# Patient Record
Sex: Male | Born: 1990 | Race: White | Hispanic: No | Marital: Single | Smoking: Current every day smoker
Health system: Southern US, Community
[De-identification: ages and names within clinical notes are randomized; demographics above are authoritative.]

## PROBLEM LIST (undated history)

## (undated) DIAGNOSIS — F319 Bipolar disorder, unspecified: Secondary | ICD-10-CM

## (undated) DIAGNOSIS — F419 Anxiety disorder, unspecified: Secondary | ICD-10-CM

## (undated) HISTORY — PX: FOOT SURGERY: SHX648

---

## 2012-09-18 ENCOUNTER — Emergency Department (HOSPITAL_COMMUNITY)
Admission: EM | Admit: 2012-09-18 | Discharge: 2012-09-19 | Disposition: A | Discharge status: Home / Self Care | Payer: Self-pay | Attending: Emergency Medicine | Admitting: Emergency Medicine

## 2012-09-18 DIAGNOSIS — F411 Generalized anxiety disorder: Secondary | ICD-10-CM | POA: Insufficient documentation

## 2012-09-18 DIAGNOSIS — F319 Bipolar disorder, unspecified: Secondary | ICD-10-CM | POA: Insufficient documentation

## 2012-09-18 DIAGNOSIS — F17200 Nicotine dependence, unspecified, uncomplicated: Secondary | ICD-10-CM | POA: Insufficient documentation

## 2012-09-18 DIAGNOSIS — F419 Anxiety disorder, unspecified: Secondary | ICD-10-CM

## 2012-09-18 HISTORY — DX: Anxiety disorder, unspecified: F41.9

## 2012-09-18 HISTORY — DX: Bipolar disorder, unspecified: F31.9

## 2012-09-19 ENCOUNTER — Encounter (HOSPITAL_COMMUNITY): Payer: Self-pay | Admitting: Emergency Medicine

## 2012-09-19 LAB — POCT I-STAT, CHEM 8
BUN: 15 mg/dL (ref 6–23)
Calcium, Ion: 1.13 mmol/L (ref 1.12–1.23)
Chloride: 105 mEq/L (ref 96–112)
Creatinine, Ser: 0.9 mg/dL (ref 0.50–1.35)

## 2012-09-19 NOTE — ED Provider Notes (Signed)
Medical screening examination/treatment/procedure(s) were performed by non-physician practitioner and as supervising physician I was immediately available for consultation/collaboration.   Lyanne Co, MD 09/19/12 [phone]

## 2012-09-19 NOTE — ED Provider Notes (Signed)
History     CSN: [id number]  Arrival date & time 09/18/12  2350   First MD Initiated Contact with Patient 09/19/12 0010      Chief Complaint  Patient presents with  . multiple complaints     (Consider location/radiation/quality/duration/timing/severity/associated sxs/prior treatment) HPI Comments: Patient states, that approximately 1 hour after smoking a marijuana cigarette.  He developed a strange sensation in his left side, and felt as though it had a muscle spasm and swelled up and, then immediately went back.  He then proceeded to have a cold sensation just on the left side of his body.  That was replaced.  Shortly thereafter put a hot sensation to the left side of his body in the back of his head.  He denies any nausea or vomiting, weakness  The history is provided by the patient.    Past Medical History  Diagnosis Date  . Bipolar affective   . Anxiety     History reviewed. No pertinent past surgical history.  Family History  Problem Relation Age of Onset  . Heart attack Other     History  Substance Use Topics  . Smoking status: Current Every Day Smoker  . Smokeless tobacco: Not on file  . Alcohol Use: No      Review of Systems  Constitutional: Negative for fever and chills.  HENT: Negative for sore throat.   Respiratory: Negative for shortness of breath.   Cardiovascular: Negative for chest pain.  Gastrointestinal: Negative for abdominal pain.  Skin: Negative for rash and wound.  Neurological: Negative for dizziness, weakness and numbness.    Allergies  Review of patient's allergies indicates no known allergies.  Home Medications  No current outpatient prescriptions on file.  BP 125/64  Pulse 125  Temp 98.7 F (37.1 C) (Oral)  Resp 20  SpO2 98%  Physical Exam  Constitutional: He is oriented to person, place, and time. He appears well-developed and well-nourished.  HENT:  Head: Normocephalic.  Eyes: Pupils are equal, round, and reactive to  light.  Neck: Normal range of motion.  Cardiovascular: Tachycardia present.   Pulmonary/Chest: Effort normal. No respiratory distress.  Abdominal: Soft. He exhibits no distension. There is no tenderness.  Musculoskeletal: Normal range of motion.  Neurological: He is alert and oriented to person, place, and time.  Skin: Skin is warm.  Psychiatric: His mood appears anxious.    ED Course  Procedures (including critical care time)  Labs Reviewed  POCT I-STAT, CHEM 8 - Abnormal; Notable for the following:    Glucose, Bld 107 (*)     All other components within normal limits   No results found.   1. Anxiety     ED ECG REPORT   Date: 09/19/2012  EKG Time: 2:47 AM  Rate: 93  Rhythm: normal sinus rhythm,  there are no previous tracings available for comparison  Axis: normal  Intervals:none  ST&T Change: none  Narrative Interpretation: normal            MDM   I feel this patient is just anxious.  EKG, and i-STAT are normal        Arman Filter, NP 09/19/12 [phone]

## 2012-09-19 NOTE — ED Notes (Signed)
Pt states earlier this evening he got a bad muscle spasm on the left side near his lower rib area  Pt states then he developed chills  Pt states it went away  Pt states later he was watching a movie and got really hot and felt like his heart was beating really fast  Pt states now he is having a hot sensation running down both arms worse in the left arm than the right

## 2012-10-03 ENCOUNTER — Encounter (HOSPITAL_COMMUNITY): Payer: Self-pay | Admitting: Emergency Medicine

## 2012-10-03 ENCOUNTER — Emergency Department (HOSPITAL_COMMUNITY): Payer: Self-pay

## 2012-10-03 ENCOUNTER — Emergency Department (HOSPITAL_COMMUNITY)
Admission: EM | Admit: 2012-10-03 | Discharge: 2012-10-03 | Disposition: A | Discharge status: Home / Self Care | Payer: Self-pay | Attending: Emergency Medicine | Admitting: Emergency Medicine

## 2012-10-03 DIAGNOSIS — S4352XA Sprain of left acromioclavicular joint, initial encounter: Secondary | ICD-10-CM

## 2012-10-03 DIAGNOSIS — X500XXA Overexertion from strenuous movement or load, initial encounter: Secondary | ICD-10-CM | POA: Insufficient documentation

## 2012-10-03 DIAGNOSIS — S4350XA Sprain of unspecified acromioclavicular joint, initial encounter: Secondary | ICD-10-CM | POA: Insufficient documentation

## 2012-10-03 DIAGNOSIS — M25519 Pain in unspecified shoulder: Secondary | ICD-10-CM | POA: Insufficient documentation

## 2012-10-03 MED ORDER — IBUPROFEN 800 MG PO TABS
800.0000 mg | ORAL_TABLET | Freq: Once | ORAL | Status: AC
  Administered 2012-10-03: 800 mg via ORAL
  Filled 2012-10-03: qty 1

## 2012-10-03 MED ORDER — IBUPROFEN 800 MG PO TABS: 800.0000 mg | ORAL_TABLET | Freq: Three times a day (TID) | ORAL | Status: DC

## 2012-10-03 NOTE — ED Provider Notes (Signed)
History     CSN: [id number]  Arrival date & time 10/03/12  0217   First MD Initiated Contact with Patient 10/03/12 0324      Chief Complaint  Patient presents with  . Shoulder Pain    (Consider location/radiation/quality/duration/timing/severity/associated sxs/prior treatment) HPI History provided by pt.   Pt developed acute onset pain distal left clavicle when he coughed at 1:30am.  Pain is mildly improved now but aggravated by palpation and ROM of LUE.  Has pain in left side of his neck as well, most noticeable when he tips his head to the right.    No associated fever or extremity weakness/paresthesias.  No recent trauma.  His cough is intermittent and occurs mostly when he is nervous.   Past Medical History  Diagnosis Date  . Bipolar affective   . Anxiety     History reviewed. No pertinent past surgical history.  Family History  Problem Relation Age of Onset  . Heart attack Other     History  Substance Use Topics  . Smoking status: Current Every Day Smoker    Types: Cigarettes  . Smokeless tobacco: Not on file  . Alcohol Use: Yes     occ      Review of Systems  All other systems reviewed and are negative.    Allergies  Penicillins  Home Medications   Current Outpatient Rx  Name Route Sig Dispense Refill  . IBUPROFEN 800 MG PO TABS Oral Take 1 tablet (800 mg total) by mouth 3 (three) times daily. 12 tablet 0    BP 120/67  Pulse 92  Temp 98.5 F (36.9 C) (Oral)  Resp 20  SpO2 100%  Physical Exam  Nursing note and vitals reviewed. Constitutional: He is oriented to person, place, and time. He appears well-developed and well-nourished. No distress.       Nervous appearing  HENT:  Head: Normocephalic and atraumatic.  Eyes:       Normal appearance  Neck: Normal range of motion.  Cardiovascular: Normal rate and regular rhythm.   Pulmonary/Chest: Effort normal and breath sounds normal. No respiratory distress.  Musculoskeletal: Normal range of  motion.       No deformity, edema, ecchymosis or other skin changes of left shoulder.  Moderate tenderness left AC joint as well as left SCM.  Palpation of SCM induces tingling sensation on the right side of his head.  No tenderness of cervical spine. Pain in shoulder w/ passive flexion/abduction past 90deg.  Pain in left side of neck aggravated by rotation and lateral bending of head to the right.  2+ radial pulse and distal sensation intact.      Neurological: He is alert and oriented to person, place, and time.  Skin: Skin is warm and dry. No rash noted.  Psychiatric: He has a normal mood and affect. His behavior is normal.    ED Course  Procedures (including critical care time)  Labs Reviewed - No data to display Dg Shoulder Left  10/03/2012  *RADIOLOGY REPORT*  Clinical Data: Pain in the left shoulder and clavicle after cough.  LEFT SHOULDER - 2+ VIEW  Comparison: None.  Findings: The left shoulder appears intact.  Coracoclavicular and acromioclavicular spaces are normal.  No evidence of acute fracture or subluxation.  No focal bone lesion or bone destruction.  Bone cortex and trabecular architecture appear intact.  IMPRESSION: No acute bony abnormality.   Original Report Authenticated By: Marlon Pel, M.D.      1. Sprain of  left acromioclavicular joint       MDM  21yo M presents w/ pain at left SCM and left AC joint that started when he coughed early this morning.  Neg shoulder xray.  S/sx most consistent w/ sprain of AC joint and strain of SCM.  Results discussed w/ pt.  Nursing staff placed in shoulder sling for comfort and pain treated w/ 800mg  ibuprofen.  Recommended ibuprofen, rest and ice at home.  Return precautions discussed.         Otilio Miu, [place] 10/03/12 [phone]

## 2012-10-03 NOTE — ED Notes (Signed)
Patient transported to X-ray 

## 2012-10-03 NOTE — ED Notes (Signed)
Pt states he was watching TV and coughed  Pt states when he did he had pain in his shoulder and states he thinks he broke his collar bone  Pt states he has pains that radiate up his neck

## 2012-10-03 NOTE — ED Provider Notes (Signed)
Medical screening examination/treatment/procedure(s) were performed by non-physician practitioner and as supervising physician I was immediately available for consultation/collaboration.  Geraldina Parrott M Makala Fetterolf, MD 10/03/12 0624 

## 2013-05-12 ENCOUNTER — Emergency Department (HOSPITAL_COMMUNITY): Payer: Commercial Managed Care - PPO

## 2013-05-12 ENCOUNTER — Emergency Department (HOSPITAL_COMMUNITY)
Admission: EM | Admit: 2013-05-12 | Discharge: 2013-05-13 | Disposition: A | Discharge status: Home / Self Care | Payer: Commercial Managed Care - PPO | Attending: Emergency Medicine | Admitting: Emergency Medicine

## 2013-05-12 DIAGNOSIS — Z8659 Personal history of other mental and behavioral disorders: Secondary | ICD-10-CM | POA: Insufficient documentation

## 2013-05-12 DIAGNOSIS — F319 Bipolar disorder, unspecified: Secondary | ICD-10-CM | POA: Insufficient documentation

## 2013-05-12 DIAGNOSIS — W268XXA Contact with other sharp object(s), not elsewhere classified, initial encounter: Secondary | ICD-10-CM | POA: Insufficient documentation

## 2013-05-12 DIAGNOSIS — Z880 Allergy status to penicillin: Secondary | ICD-10-CM | POA: Insufficient documentation

## 2013-05-12 DIAGNOSIS — F17200 Nicotine dependence, unspecified, uncomplicated: Secondary | ICD-10-CM | POA: Insufficient documentation

## 2013-05-12 DIAGNOSIS — Y9389 Activity, other specified: Secondary | ICD-10-CM | POA: Insufficient documentation

## 2013-05-12 DIAGNOSIS — S41112A Laceration without foreign body of left upper arm, initial encounter: Secondary | ICD-10-CM

## 2013-05-12 DIAGNOSIS — Y9289 Other specified places as the place of occurrence of the external cause: Secondary | ICD-10-CM | POA: Insufficient documentation

## 2013-05-12 DIAGNOSIS — S51809A Unspecified open wound of unspecified forearm, initial encounter: Secondary | ICD-10-CM | POA: Insufficient documentation

## 2013-05-12 MED ORDER — TETANUS-DIPHTH-ACELL PERTUSSIS 5-2.5-18.5 LF-MCG/0.5 IM SUSP: 0.5000 mL | Freq: Once | INTRAMUSCULAR | Status: DC

## 2013-05-12 NOTE — ED Notes (Signed)
Pt states that he was running around with friends and ran into a glass door and his arm went through the arm; lacerations to left lower arm.

## 2013-05-12 NOTE — ED Provider Notes (Signed)
History     CSN: [id number]  Arrival date & time 05/12/13  2322   First MD Initiated Contact with Patient 05/12/13 2334      No chief complaint on file.   (Consider location/radiation/quality/duration/timing/severity/associated sxs/prior treatment) HPI History per patient. Running and slipped and left hand/ forearm went through a glass storm door. No SI/ HI. Bleeding controlled PTA, last tetatnus shot unk. No weakness or numbness. Pain sharp not radiating, mod in severity, no weakness or numbness. No obvious FB sensation. Onset just PTA.  Past Medical History  Diagnosis Date  . Bipolar affective   . Anxiety     No past surgical history on file.  Family History  Problem Relation Age of Onset  . Heart attack Other     History  Substance Use Topics  . Smoking status: Current Every Day Smoker    Types: Cigarettes  . Smokeless tobacco: Not on file  . Alcohol Use: Yes     Comment: occ      Review of Systems  Constitutional: Negative for fever and chills.  HENT: Negative for neck pain and neck stiffness.   Eyes: Negative for visual disturbance.  Respiratory: Negative for shortness of breath.   Cardiovascular: Negative for chest pain.  Gastrointestinal: Negative for abdominal pain.  Genitourinary: Negative for dysuria.  Musculoskeletal: Negative for back pain.  Skin: Positive for wound. Negative for rash.  Neurological: Negative for headaches.  All other systems reviewed and are negative.    Allergies  Penicillins  Home Medications   Current Outpatient Rx  Name  Route  Sig  Dispense  Refill  . ibuprofen (ADVIL,MOTRIN) 800 MG tablet   Oral   Take 1 tablet (800 mg total) by mouth 3 (three) times daily.   12 tablet   0     There were no vitals taken for this visit.  Physical Exam  Constitutional: He is oriented to person, place, and time. He appears well-developed and well-nourished.  HENT:  Head: Normocephalic and atraumatic.  Eyes: EOM are normal.  Pupils are equal, round, and reactive to light.  Neck: Neck supple.  Cardiovascular: Regular rhythm and intact distal pulses.   Pulmonary/Chest: Effort normal. No respiratory distress.  Musculoskeletal: Normal range of motion. He exhibits no edema.  L FA: multiple superficial lacerations to volar aspect, all hemostatic, distal pulses and N/V equal and intact. Curved linear 1.5cm to ulnar volar aspect full thickness.   Neurological: He is alert and oriented to person, place, and time.  Skin: Skin is warm and dry.    ED Course  LACERATION REPAIR Date/Time: 05/13/2013 1:00 AM Performed by: Sunnie Nielsen Authorized by: Sunnie Nielsen Consent: Verbal consent obtained. Risks and benefits: risks, benefits and alternatives were discussed Consent given by: patient Patient understanding: patient states understanding of the procedure being performed Patient consent: the patient's understanding of the procedure matches consent given Required items: required blood products, implants, devices, and special equipment available Patient identity confirmed: verbally with patient Time out: Immediately prior to procedure a "time out" was called to verify the correct patient, procedure, equipment, support staff and site/side marked as required. Body area: upper extremity Location details: left lower arm Laceration length: 1.5 cm Tendon involvement: none Nerve involvement: none Vascular damage: no Anesthesia: local infiltration Local anesthetic: lidocaine 1% without epinephrine Anesthetic total: 2 ml Preparation: Patient was prepped and draped in the usual sterile fashion. Irrigation solution: saline Irrigation method: syringe Amount of cleaning: extensive Skin closure: 5-0 nylon Number of sutures: 2 Technique:  simple Approximation: close Approximation difficulty: simple Dressing: antibiotic ointment and 4x4 sterile gauze Patient tolerance: Patient tolerated the procedure well with no immediate  complications.   (including critical care time)  Xray L forearm obtained and reviewed by myself - no obvious FB  Lacerations Plan SR x 7 days Infection precautions provided Wound care provide - tetanus updated  MDM  Multiple lacerations mostly superficial, one deep wound requiring sutures as above Xray reviewed VS and nursing notes reviewed and considered        Sunnie Nielsen, MD 05/13/13 [phone]

## 2013-05-13 ENCOUNTER — Encounter (HOSPITAL_COMMUNITY): Payer: Self-pay | Admitting: *Deleted

## 2014-09-19 IMAGING — CR DG FOREARM 2V*L*
2 series · 2 of 2 positions shown · non-contrast
Comparison: None.

CLINICAL DATA: Multiple lacerations to the distal anterior left
forearm.

LEFT FOREARM - 2 VIEW

[x forearm ap left]
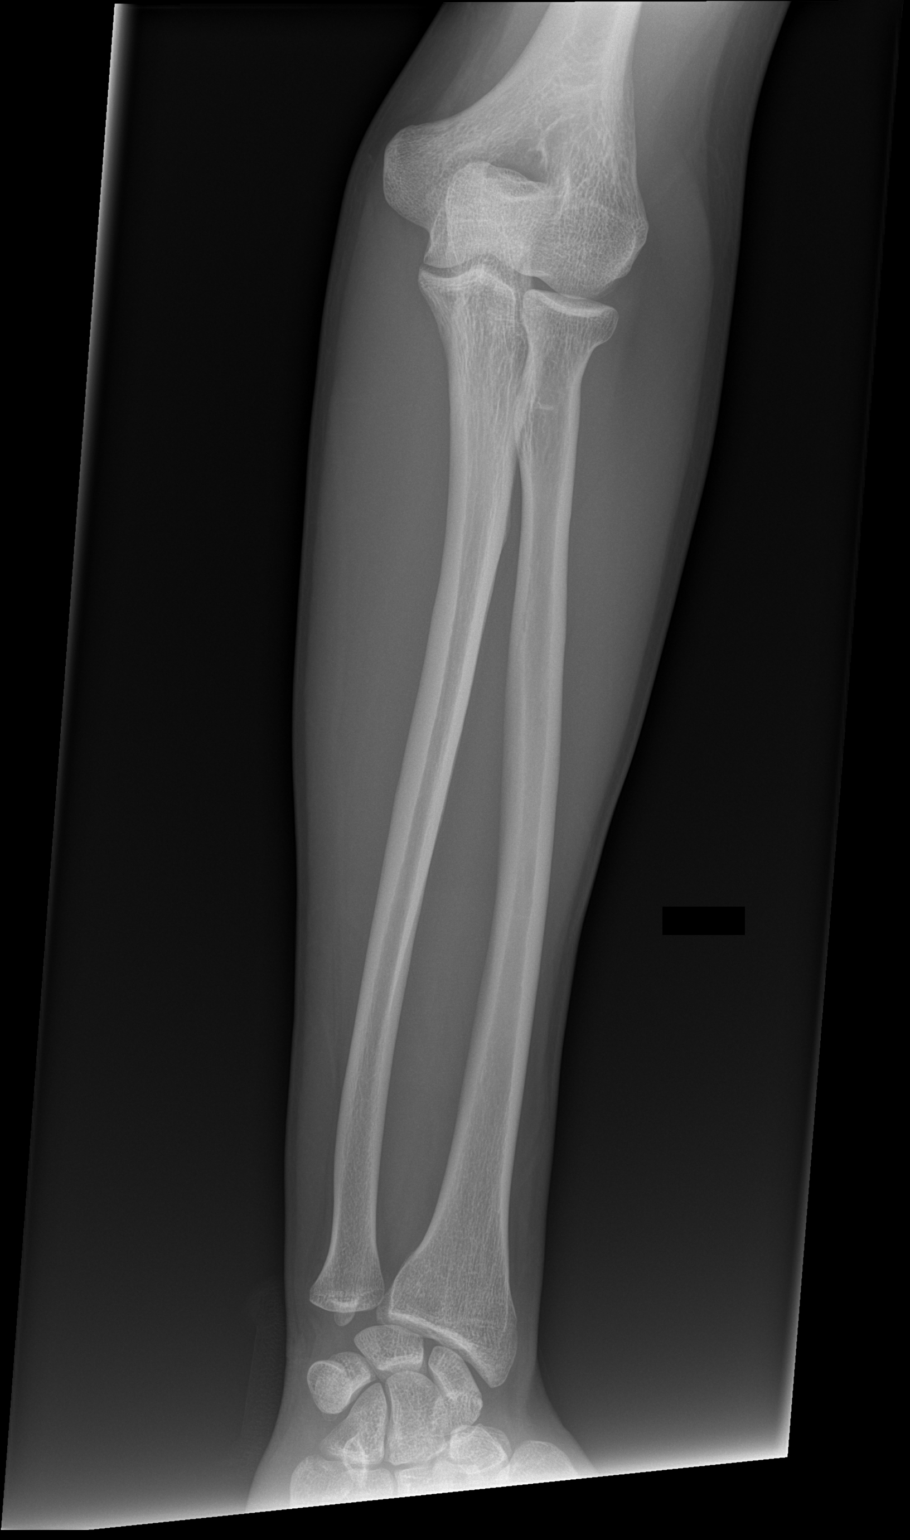

[x forearm lat left]
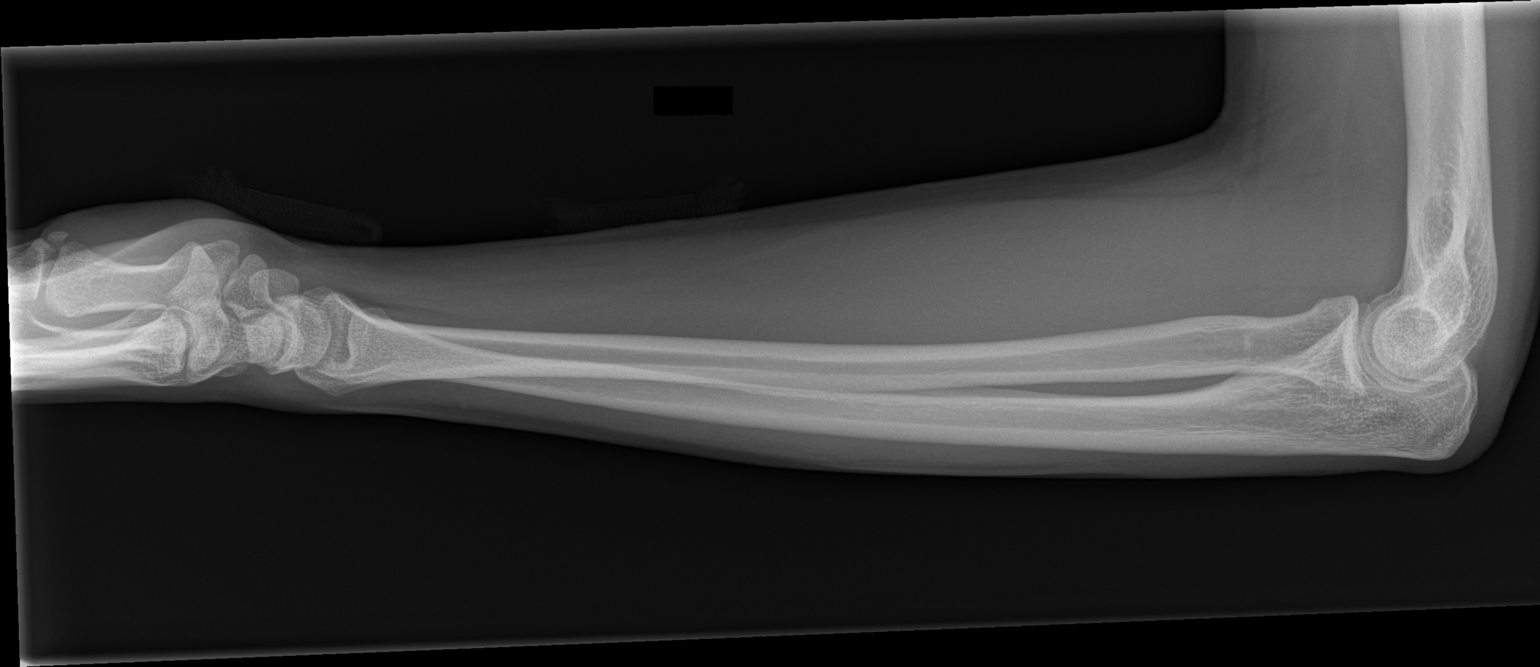

[2 of 2 positions shown; findings below may reference images not displayed]

FINDINGS: There is no evidence of fracture or dislocation.  Known
lacerations are not well characterized on radiograph.  No
radiopaque foreign bodies are seen.  The radius and ulna appear
intact.

The elbow joint is grossly unremarkable in appearance.  No elbow
joint effusion is identified.  The carpal rows appear intact, and
demonstrate normal alignment.
IMPRESSION: No evidence of fracture or dislocation.  No radiopaque foreign
bodies seen.

## 2020-10-22 ENCOUNTER — Ambulatory Visit (INDEPENDENT_AMBULATORY_CARE_PROVIDER_SITE_OTHER): Payer: 59 | Admitting: Internal Medicine

## 2020-10-22 VITALS — BP 162/96 | HR 81 | Ht 72.0 in | Wt 240.0 lb

## 2020-10-22 DIAGNOSIS — G4719 Other hypersomnia: Secondary | ICD-10-CM | POA: Diagnosis not present

## 2020-10-22 DIAGNOSIS — I10 Essential (primary) hypertension: Secondary | ICD-10-CM | POA: Diagnosis not present

## 2020-10-22 DIAGNOSIS — G2581 Restless legs syndrome: Secondary | ICD-10-CM | POA: Diagnosis not present

## 2020-10-22 NOTE — Progress Notes (Signed)
Sleep Medicine   Office Visit  Patient Name: Clebert Wenger DOB: July 04, 1991 MRN [id number]    Chief Complaint: study results  Brief History:  Linton is seen for initial consultation to discuss the results of his recent home sleep studies. The findings of these studies were unremarkable. He reports a long history of daytime sleepiness. He can sleep for up to 14 hours and still not feel rested. He goes to bed 8:30-9p.m. and gets up at 6:30 a.m. It takes him 2 hours to fall asleep. He lies in bed listening to the TV.  On weekends he will get up at 9 a.m. but would sleep until 11 if his dog did not wake him. He is being treated for depression but does not feel it is fully controlled. His psychiatrist believes he is much sleepier than he should be with his depression.  He recalls one episode of sleep paralysis but denies hypnogogic hallucinations and cataplexy.  He has been on anti-depressant medication for 8 years.  He has been told that he snores and stops breathing at night but is unaware of this.   Patient has noted restlessness of his legs during the day and at night.  The patient  relates no unusual behavior during the night.  The Epworth Sleepiness Score is 12 out of 24. He reports feeling tired all day. He will nap for 1-2 hours on his days off but the naps are not refreshing. He tries to avoid naps but may fall asleep unexpectedly.  He was on Vyvanse which helped for a while but lost its effectiveness. He was on Adderall but also built a tolerance for that.    ROS  General: (-) fever, (-) chills, (-) night sweat Nose and Sinuses: (-) nasal stuffiness or itchiness, (-) postnasal drip, (-) nosebleeds, (-) sinus trouble. Mouth and Throat: (-) sore throat, (-) hoarseness. Neck: (-) swollen glands, (-) enlarged thyroid, (-) neck pain. Respiratory: - cough, - shortness of breath, - wheezing. Neurologic: - numbness, - tingling. Psychiatric: + anxiety, + depression Sleep behavior: +sleep  paralysis -hypnogogic hallucinations -dream enactment      -vivid dreams -cataplexy -night terrors -sleep walking   Current Medication: Outpatient Encounter Medications as of 10/22/2020  Medication Sig  . ibuprofen (ADVIL,MOTRIN) 200 MG tablet Take 400-800 mg by mouth every 6 (six) hours as needed for pain.   No facility-administered encounter medications on file as of 10/22/2020.    Surgical History: Past Surgical History:  Procedure Laterality Date  . FOOT SURGERY      Medical History: Past Medical History:  Diagnosis Date  . Anxiety   . Bipolar affective     Family History: Non contributory to the present illness  Social History: Social History   Socioeconomic History  . Marital status: Single    Spouse name: Not on file  . Number of children: Not on file  . Years of education: Not on file  . Highest education level: Not on file  Occupational History  . Not on file  Tobacco Use  . Smoking status: Current Every Day Smoker    Types: Cigarettes  Substance and Sexual Activity  . Alcohol use: Yes    Comment: occ  . Drug use: Yes    Types: Marijuana  . Sexual activity: Not on file  Other Topics Concern  . Not on file  Social History Narrative  . Not on file   Social Determinants of Health   Financial Resource Strain:   . Difficulty of Paying Living  Expenses: Not on file  Food Insecurity:   . Worried About Programme researcher, broadcasting/film/video in the Last Year: Not on file  . Ran Out of Food in the Last Year: Not on file  Transportation Needs:   . Lack of Transportation (Medical): Not on file  . Lack of Transportation (Non-Medical): Not on file  Physical Activity:   . Days of Exercise per Week: Not on file  . Minutes of Exercise per Session: Not on file  Stress:   . Feeling of Stress : Not on file  Social Connections:   . Frequency of Communication with Friends and Family: Not on file  . Frequency of Social Gatherings with Friends and Family: Not on file  . Attends  Religious Services: Not on file  . Active Member of Clubs or Organizations: Not on file  . Attends Banker Meetings: Not on file  . Marital Status: Not on file  Intimate Partner Violence:   . Fear of Current or Ex-Partner: Not on file  . Emotionally Abused: Not on file  . Physically Abused: Not on file  . Sexually Abused: Not on file    Vital Signs: There were no vitals taken for this visit.  Examination: General Appearance: The patient is well-developed, well-nourished, and in no distress. Neck Circumference: 45 Skin: Gross inspection of skin unremarkable. Head: normocephalic, no gross deformities. Eyes: no gross deformities noted. ENT: ears appear grossly normal Neurologic: Alert and oriented. No involuntary movements.    EPWORTH SLEEPINESS SCALE:  Scale:  (0)= no chance of dozing; (1)= slight chance of dozing; (2)= moderate chance of dozing; (3)= high chance of dozing  Chance  Situtation    Sitting and reading: 1    Watching TV: 1    Sitting Inactive in public: 2    As a passenger in car: 2      Lying down to rest: 3    Sitting and talking: 1    Sitting quielty after lunch: 2    In a car, stopped in traffic: 0   TOTAL SCORE:   12 out of 24    SLEEP STUDIES:  1. HST 09/23/20 (second study both unremarkable)   LABS: No results found for this or any previous visit (from the past 2160 hour(s)).  Radiology: DG Forearm Left  Result Date: 05/13/2013 *RADIOLOGY REPORT* Clinical Data: Multiple lacerations to the distal anterior left forearm. LEFT FOREARM - 2 VIEW Comparison: None. Findings: There is no evidence of fracture or dislocation.  Known lacerations are not well characterized on radiograph.  No radiopaque foreign bodies are seen.  The radius and ulna appear intact. The elbow joint is grossly unremarkable in appearance.  No elbow joint effusion is identified.  The carpal rows appear intact, and demonstrate normal alignment. IMPRESSION: No  evidence of fracture or dislocation.  No radiopaque foreign bodies seen. Original Report Authenticated By: Tonia Ghent, M.D.    No results found.  No results found.    Assessment and Plan: There are no problems to display for this patient.      PLAN hypersomnia:  Patient evaluation suggests significant daytime hypersomnia.  The report of long periods of poorly refreshing sleep suggeste idiopathic hypersomnia, however the use of antidepressants can mask symptoms of narcolepsy. The Epworth Sleepiness Score is elevated at 12 out of 24. Despite 8+ hours of sleep.  Patient denies drowsy driving.   The patient reports restless leg symptoms which occur throughout the day and at night. Suggest: PSG/MSLT to rule  out narcolepsy and idiopathic hypersomnia.  Due to his depression he will need to remain on his Prozac for the study.     1. 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 2. RLS- will follow up after studies. 3. Obesity diet and exercise 4. HTN follow up with PCP  General Counseling: I have discussed the findings of the evaluation and examination with Anjelo.  I have also discussed any further diagnostic evaluation thatmay be needed or ordered today. Giavanni verbalizes understanding of the findings of todays visit. We also reviewed his medications today and discussed drug interactions and side effects including but not limited excessive drowsiness and altered mental states. We also discussed that there is always a risk not just to him but also people around him. he has been encouraged to call the office with any questions or concerns that should arise related to todays visit.  No orders of the defined types were placed in this encounter.       I have personally obtained a history, evaluated the patient, evaluated pertinent data, formulated the assessment and plan and placed orders.   Valentino Hue Sol Blazing, PhD, FAASM  Diplomate, American Board of Sleep  Medicine    Yevonne Pax, MD [facility] Diplomate ABMS Pulmonary and Critical Care Medicine Sleep medicine
